# Patient Record
Sex: Male | Born: 2000
Health system: Southern US, Community
[De-identification: ages and names within clinical notes are randomized; demographics above are authoritative.]

## PROBLEM LIST (undated history)

## (undated) DIAGNOSIS — K219 Gastro-esophageal reflux disease without esophagitis: Secondary | ICD-10-CM

## (undated) HISTORY — PX: UMBILICAL HERNIA REPAIR: SHX196

## (undated) HISTORY — PX: URETHRAL DILATION: SUR417

## (undated) HISTORY — DX: Gastro-esophageal reflux disease without esophagitis: K21.9

---
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
## 2019-07-26 ENCOUNTER — Telehealth: Payer: Self-pay | Admitting: Family

## 2019-07-26 ENCOUNTER — Other Ambulatory Visit: Payer: Self-pay | Admitting: *Deleted

## 2019-07-26 DIAGNOSIS — Z20822 Contact with and (suspected) exposure to covid-19: Secondary | ICD-10-CM

## 2019-07-27 ENCOUNTER — Encounter: Payer: Self-pay | Admitting: Family Medicine

## 2019-07-27 ENCOUNTER — Ambulatory Visit (INDEPENDENT_AMBULATORY_CARE_PROVIDER_SITE_OTHER): Payer: Managed Care, Other (non HMO) | Admitting: Family Medicine

## 2019-07-27 DIAGNOSIS — J029 Acute pharyngitis, unspecified: Secondary | ICD-10-CM

## 2019-07-27 LAB — NOVEL CORONAVIRUS, NAA: SARS-CoV-2, NAA: NOT DETECTED

## 2019-07-27 MED ORDER — AMOXICILLIN 500 MG PO CAPS: 500.0000 mg | ORAL_CAPSULE | Freq: Two times a day (BID) | ORAL | 0 refills | Status: DC

## 2019-07-27 NOTE — Progress Notes (Signed)
   Virtual Visit via telephone Note  I connected with Carl Delgado on 07/27/19 at 1719 by telephone and verified that I am speaking with the correct person using two identifiers. Carl Delgado is currently located at home and no other people are currently with her during visit. The provider, Fransisca Kaufmann , MD is located in their office at time of visit.  Call ended at 1725  I discussed the limitations, risks, security and privacy concerns of performing an evaluation and management service by telephone and the availability of in person appointments. I also discussed with the patient that there may be a patient responsible charge related to this service. The patient expressed understanding and agreed to proceed.   History and Present Illness: Patient is calling in with lightheadedness and dizziness and sore throat. He also had nausea and vomiting.  His throat is swollen and hurts to swallow.  He had fever and chills.  He denies SOB or wheezing and he tested negative for covid.   No diagnosis found.  Outpatient Encounter Medications as of 07/27/2019  Medication Sig  . fluticasone (FLONASE) 50 MCG/ACT nasal spray Place 1 spray into both nostrils 2 (two) times daily as needed for allergies or rhinitis.   No facility-administered encounter medications on file as of 07/27/2019.     Review of Systems  Constitutional: Positive for chills and fever.  HENT: Positive for congestion, postnasal drip, rhinorrhea, sinus pressure and sore throat. Negative for ear discharge, ear pain, sneezing and voice change.   Eyes: Negative for pain, discharge, redness and visual disturbance.  Respiratory: Positive for cough. Negative for shortness of breath and wheezing.   Cardiovascular: Negative for chest pain and leg swelling.  Musculoskeletal: Negative for gait problem.  Skin: Negative for rash.  All other systems reviewed and are negative.   Observations/Objective: Patient sounds comfortable and in no acute  distress  Assessment and Plan: Problem List Items Addressed This Visit    None    Visit Diagnoses    Pharyngitis, unspecified etiology    -  Primary   Relevant Medications   amoxicillin (AMOXIL) 500 MG capsule       Follow Up Instructions:  As needed, continue to quarantine   I discussed the assessment and treatment plan with the patient. The patient was provided an opportunity to ask questions and all were answered. The patient agreed with the plan and demonstrated an understanding of the instructions.   The patient was advised to call back or seek an in-person evaluation if the symptoms worsen or if the condition fails to improve as anticipated.  The above assessment and management plan was discussed with the patient. The patient verbalized understanding of and has agreed to the management plan. Patient is aware to call the clinic if symptoms persist or worsen. Patient is aware when to return to the clinic for a follow-up visit. Patient educated on when it is appropriate to go to the emergency department.    I provided 6 minutes of non-face-to-face time during this encounter.    Worthy Rancher, MD

## 2020-01-09 ENCOUNTER — Other Ambulatory Visit: Payer: Self-pay

## 2020-01-10 ENCOUNTER — Ambulatory Visit (INDEPENDENT_AMBULATORY_CARE_PROVIDER_SITE_OTHER): Payer: BC Managed Care – PPO | Admitting: Family Medicine

## 2020-01-10 ENCOUNTER — Encounter: Payer: Self-pay | Admitting: Family Medicine

## 2020-01-10 VITALS — BP 117/73 | HR 64 | Temp 98.6°F | Ht 66.0 in | Wt 126.8 lb

## 2020-01-10 DIAGNOSIS — Z136 Encounter for screening for cardiovascular disorders: Secondary | ICD-10-CM | POA: Diagnosis not present

## 2020-01-10 DIAGNOSIS — Z0000 Encounter for general adult medical examination without abnormal findings: Secondary | ICD-10-CM | POA: Diagnosis not present

## 2020-01-10 DIAGNOSIS — Z114 Encounter for screening for human immunodeficiency virus [HIV]: Secondary | ICD-10-CM

## 2020-01-10 NOTE — Progress Notes (Signed)
Assessment & Plan:  1. Well adult exam - Preventive health education provided. Patient declined lab work and influenza. His TDAP is due later this year; patient was made aware. HIV screening completed today.  - HIV Antibody (routine testing w rflx) - CBC with Differential/Platelet - CMP14+EGFR - Lipid panel  2. Encounter for screening for HIV - HIV Antibody (routine testing w rflx)   Follow-up: Return in about 1 year (around 01/09/2021) for annual physical.   Carl Limes, MSN, APRN, FNP-C [facility]  Subjective:  Patient ID: Carl Delgado, male    DOB: 10-Apr-2001  Age: 19 y.o. MRN: [id number]  Patient Care Team: Sharion Balloon, FNP as PCP - General (Family Medicine)   CC:  Chief Complaint  Patient presents with  . Annual Exam    CPEFOR WORK     HPI Carl Delgado presents for his annual physical.   Occupation: [name], Marital status: single, Substance use: none Diet: regular, Exercise: stays active and work is physical Last eye exam: few months ago Last dental exam: 6 months ago Immunizations: Flu Vaccine: declined  Tdap Vaccine: UTD   No complaints or concerns today.   Depression screen [facility] 2/9 01/10/2020 04/23/2017 03/16/2017  Decreased Interest 0 2 0  Down, Depressed, Hopeless 1 2 0  PHQ - 2 Score 1 4 0  Altered sleeping 3 3 -  Tired, decreased energy 3 3 -  Change in appetite 2 2 -  Feeling bad or failure about yourself  0 1 -  Trouble concentrating 1 1 -  Moving slowly or fidgety/restless 1 1 -  Suicidal thoughts 1 0 -  PHQ-9 Score 12 15 -  Difficult doing work/chores Not difficult at all Not difficult at all -   GAD 7 : Generalized Anxiety Score 01/10/2020  Nervous, Anxious, on Edge 3  Control/stop worrying 2  Worry too much - different things 3  Trouble relaxing 3  Restless 3  Easily annoyed or irritable 3  Afraid - awful might happen 2  Total GAD 7 Score 19  Anxiety Difficulty Not difficult at all   Patient  reports he does have a lot of anxiety when in large crowds but otherwise is able to control it well and does not feel he requires medication to treat.    Review of Systems  Constitutional: Negative for chills, fever, malaise/fatigue and weight loss.  HENT: Negative for congestion, ear discharge, ear pain, nosebleeds, sinus pain, sore throat and tinnitus.   Eyes: Negative for blurred vision, double vision, pain, discharge and redness.  Respiratory: Negative for cough, shortness of breath and wheezing.   Cardiovascular: Negative for chest pain, palpitations and leg swelling.  Gastrointestinal: Negative for abdominal pain, constipation, diarrhea, heartburn, nausea and vomiting.  Genitourinary: Negative for dysuria, frequency and urgency.  Musculoskeletal: Negative for myalgias.  Skin: Negative for rash.  Neurological: Negative for dizziness, seizures, weakness and headaches.  Psychiatric/Behavioral: Negative for depression, substance abuse and suicidal ideas. The patient is not nervous/anxious.    No current outpatient medications on file.  No Known Allergies  Past Medical History:  Diagnosis Date  . GERD (gastroesophageal reflux disease)     Past Surgical History:  Procedure Laterality Date  . UMBILICAL HERNIA REPAIR    . [place]    History reviewed. No pertinent family history.  Social History   Socioeconomic History  . Marital status: Single    Spouse name: Not on file  . Number of children: Not on  file  . Years of education: Not on file  . Highest education level: Not on file  Occupational History  . Not on file  Tobacco Use  . Smoking status: Never Smoker  . Smokeless tobacco: Never Used  Substance and Sexual Activity  . Alcohol use: No  . Drug use: No  . Sexual activity: Not on file  Other Topics Concern  . Not on file  Social History Narrative  . Not on file   Social Determinants of Health   Financial Resource Strain:   . Difficulty of  Paying Living Expenses: Not on file  Food Insecurity:   . Worried About Charity fundraiser in the Last Year: Not on file  . Ran Out of Food in the Last Year: Not on file  Transportation Needs:   . Lack of Transportation (Medical): Not on file  . Lack of Transportation (Non-Medical): Not on file  Physical Activity:   . Days of Exercise per Week: Not on file  . Minutes of Exercise per Session: Not on file  Stress:   . Feeling of Stress : Not on file  Social Connections:   . Frequency of Communication with Friends and Family: Not on file  . Frequency of Social Gatherings with Friends and Family: Not on file  . Attends Religious Services: Not on file  . Active Member of Clubs or Organizations: Not on file  . Attends Archivist Meetings: Not on file  . Marital Status: Not on file  Intimate Partner Violence:   . Fear of Current or Ex-Partner: Not on file  . Emotionally Abused: Not on file  . Physically Abused: Not on file  . Sexually Abused: Not on file      Objective:    BP 117/73   Pulse 64   Temp 98.6 F (37 C) (Temporal)   Ht 5' 6"  (1.676 m)   Wt 126 lb 12.8 oz (57.5 kg)   SpO2 100%   BMI 20.47 kg/m   Wt Readings from Last 3 Encounters:  01/10/20 126 lb 12.8 oz (57.5 kg)  04/23/17 120 lb (54.4 kg) (6 %, Z= -1.55)*  03/16/17 119 lb (54 kg) (6 %, Z= -1.59)*   * Growth percentiles are based on CDC (Boys, 2-20 Years) data.    Physical Exam Vitals reviewed.  Constitutional:      General: He is not in acute distress.    Appearance: Normal appearance. He is normal weight. He is not ill-appearing, toxic-appearing or diaphoretic.  HENT:     Head: Normocephalic and atraumatic.     Right Ear: Tympanic membrane, ear canal and external ear normal. There is no impacted cerumen.     Left Ear: Tympanic membrane, ear canal and external ear normal. There is no impacted cerumen.     Nose: Nose normal. No congestion or rhinorrhea.     Mouth/Throat:     Mouth: Mucous  membranes are moist.     Pharynx: Oropharynx is clear. No oropharyngeal exudate or posterior oropharyngeal erythema.  Eyes:     General: No scleral icterus.       Right eye: No discharge.        Left eye: No discharge.     Conjunctiva/sclera: Conjunctivae normal.     Pupils: Pupils are equal, round, and reactive to light.  Cardiovascular:     Rate and Rhythm: Normal rate and regular rhythm.     Heart sounds: Normal heart sounds. No murmur. No friction rub. No gallop.  Pulmonary:     Effort: Pulmonary effort is normal. No respiratory distress.     Breath sounds: Normal breath sounds. No stridor. No wheezing, rhonchi or rales.  Abdominal:     General: Abdomen is flat. Bowel sounds are normal. There is no distension.     Palpations: Abdomen is soft. There is no mass.     Tenderness: There is no abdominal tenderness. There is no guarding or rebound.     Hernia: No hernia is present.  Musculoskeletal:        General: Normal range of motion.     Cervical back: Normal range of motion and neck supple. No rigidity. No muscular tenderness.     Right lower leg: No edema.     Left lower leg: No edema.  Lymphadenopathy:     Cervical: No cervical adenopathy.  Skin:    General: Skin is warm and dry.     Capillary Refill: Capillary refill takes less than 2 seconds.  Neurological:     General: No focal deficit present.     Mental Status: He is alert and oriented to person, place, and time. Mental status is at baseline.  Psychiatric:        Mood and Affect: Mood normal.        Behavior: Behavior normal.        Thought Content: Thought content normal.        Judgment: Judgment normal.     Lab Results  Component Value Date   TSH 1.720 08/20/2014   Lab Results  Component Value Date   WBC 6.9 08/20/2014   HGB 14.5 08/20/2014   HCT 44.1 08/20/2014   MCV 86.0 08/20/2014   PLT 266 04/06/2014   Lab Results  Component Value Date   NA 140 08/20/2014   K 4.0 08/20/2014   CO2 24 08/20/2014    GLUCOSE 145 (H) 08/20/2014   BUN 9 08/20/2014   CREATININE 0.90 08/20/2014   BILITOT 0.3 08/20/2014   ALKPHOS 124 08/20/2014   AST 16 08/20/2014   ALT 7 08/20/2014   PROT 7.5 08/20/2014   ALBUMIN 4.6 08/20/2014   CALCIUM 9.4 08/20/2014   No results found for: CHOL No results found for: HDL No results found for: LDLCALC No results found for: TRIG No results found for: [facility] Lab Results  Component Value Date   HGBA1C 5.1% 08/27/2014

## 2020-01-10 NOTE — Patient Instructions (Signed)
Preventive Care 19-21 Years Old, Male Preventive care refers to lifestyle choices and visits with your health care provider that can promote health and wellness. This includes:  A yearly physical exam. This is also called an annual well check.  Regular dental and eye exams.  Immunizations.  Screening for certain conditions.  Healthy lifestyle choices, such as eating a healthy diet, getting regular exercise, not using drugs or products that contain nicotine and tobacco, and limiting alcohol use. What can I expect for my preventive care visit? Physical exam Your health care provider will check:  Height and weight. These may be used to calculate body mass index (BMI), which is a measurement that tells if you are at a healthy weight.  Heart rate and blood pressure.  Your skin for abnormal spots. Counseling Your health care provider may ask you questions about:  Alcohol, tobacco, and drug use.  Emotional well-being.  Home and relationship well-being.  Sexual activity.  Eating habits.  Work and work Statistician. What immunizations do I need?  Influenza (flu) vaccine  This is recommended every year. Tetanus, diphtheria, and pertussis (Tdap) vaccine  You may need a Td booster every 10 years. Varicella (chickenpox) vaccine  You may need this vaccine if you have not already been vaccinated. Human papillomavirus (HPV) vaccine  If recommended by your health care provider, you may need three doses over 6 months. Measles, mumps, and rubella (MMR) vaccine  You may need at least one dose of MMR. You may also need a second dose. Meningococcal conjugate (MenACWY) vaccine  One dose is recommended if you are 45-76 years old and a Market researcher living in a residence hall, or if you have one of several medical conditions. You may also need additional booster doses. Pneumococcal conjugate (PCV13) vaccine  You may need this if you have certain conditions and were not  previously vaccinated. Pneumococcal polysaccharide (PPSV23) vaccine  You may need one or two doses if you smoke cigarettes or if you have certain conditions. Hepatitis A vaccine  You may need this if you have certain conditions or if you travel or work in places where you may be exposed to hepatitis A. Hepatitis B vaccine  You may need this if you have certain conditions or if you travel or work in places where you may be exposed to hepatitis B. Haemophilus influenzae type b (Hib) vaccine  You may need this if you have certain risk factors. You may receive vaccines as individual doses or as more than one vaccine together in one shot (combination vaccines). Talk with your health care provider about the risks and benefits of combination vaccines. What tests do I need? Blood tests  Lipid and cholesterol levels. These may be checked every 5 years starting at age 17.  Hepatitis C test.  Hepatitis B test. Screening   Diabetes screening. This is done by checking your blood sugar (glucose) after you have not eaten for a while (fasting).  Sexually transmitted disease (STD) testing. Talk with your health care provider about your test results, treatment options, and if necessary, the need for more tests. Follow these instructions at home: Eating and drinking   Eat a diet that includes fresh fruits and vegetables, whole grains, lean protein, and low-fat dairy products.  Take vitamin and mineral supplements as recommended by your health care provider.  Do not drink alcohol if your health care provider tells you not to drink.  If you drink alcohol: ? Limit how much you have to 0-2  drinks a day. ? Be aware of how much alcohol is in your drink. In the [place] one drink equals one 12 oz bottle of beer (355 mL), one 5 oz glass of wine (148 mL), or one 1 oz glass of hard liquor (44 mL). Lifestyle  Take daily care of your teeth and gums.  Stay active. Exercise for at least 30 minutes on 5 or  more days each week.  Do not use any products that contain nicotine or tobacco, such as cigarettes, e-cigarettes, and chewing tobacco. If you need help quitting, ask your health care provider.  If you are sexually active, practice safe sex. Use a condom or other form of protection to prevent STIs (sexually transmitted infections). What's next?  Go to your health care provider once a year for a well check visit.  Ask your health care provider how often you should have your eyes and teeth checked.  Stay up to date on all vaccines. This information is not intended to replace advice given to you by your health care provider. Make sure you discuss any questions you have with your health care provider. Document Revised: 11/03/2018 Document Reviewed: 11/03/2018 Elsevier Patient Education  2020 [employer].

## 2020-01-11 LAB — CBC WITH DIFFERENTIAL/PLATELET
Basophils Absolute: 0.1 10*3/uL (ref 0.0–0.2)
Basos: 1 %
EOS (ABSOLUTE): 0.1 10*3/uL (ref 0.0–0.4)
Eos: 2 %
Hematocrit: 46.6 % (ref 37.5–51.0)
Hemoglobin: 15.7 g/dL (ref 13.0–17.7)
Immature Grans (Abs): 0 10*3/uL (ref 0.0–0.1)
Immature Granulocytes: 0 %
Lymphocytes Absolute: 1.8 10*3/uL (ref 0.7–3.1)
Lymphs: 32 %
MCH: 29.6 pg (ref 26.6–33.0)
MCHC: 33.7 g/dL (ref 31.5–35.7)
MCV: 88 fL (ref 79–97)
Monocytes Absolute: 0.5 10*3/uL (ref 0.1–0.9)
Monocytes: 9 %
Neutrophils Absolute: 3.1 10*3/uL (ref 1.4–7.0)
Neutrophils: 56 %
Platelets: 275 10*3/uL (ref 150–450)
RBC: 5.3 x10E6/uL (ref 4.14–5.80)
RDW: 13.2 % (ref 11.6–15.4)
WBC: 5.6 10*3/uL (ref 3.4–10.8)

## 2020-01-11 LAB — CMP14+EGFR
ALT: 15 IU/L (ref 0–44)
AST: 25 IU/L (ref 0–40)
Albumin/Globulin Ratio: 1.6 (ref 1.2–2.2)
Albumin: 4.6 g/dL (ref 4.1–5.2)
Alkaline Phosphatase: 80 IU/L (ref 39–117)
BUN/Creatinine Ratio: 13 (ref 9–20)
BUN: 12 mg/dL (ref 6–20)
Bilirubin Total: 0.7 mg/dL (ref 0.0–1.2)
CO2: 25 mmol/L (ref 20–29)
Calcium: 9.5 mg/dL (ref 8.7–10.2)
Chloride: 103 mmol/L (ref 96–106)
Creatinine, Ser: 0.94 mg/dL (ref 0.76–1.27)
GFR calc Af Amer: 133 mL/min/{1.73_m2} (ref >59)
GFR calc non Af Amer: 115 mL/min/{1.73_m2} (ref >59)
Globulin, Total: 2.8 g/dL (ref 1.5–4.5)
Glucose: 93 mg/dL (ref 65–99)
Potassium: 4.3 mmol/L (ref 3.5–5.2)
Sodium: 141 mmol/L (ref 134–144)
Total Protein: 7.4 g/dL (ref 6.0–8.5)

## 2020-01-11 LAB — LIPID PANEL
Chol/HDL Ratio: 3.3 ratio (ref 0.0–5.0)
Cholesterol, Total: 160 mg/dL (ref 100–199)
HDL: 48 mg/dL (ref >39)
LDL Chol Calc (NIH): 103 mg/dL — ABNORMAL HIGH (ref 0–99)
Triglycerides: 44 mg/dL (ref 0–149)
VLDL Cholesterol Cal: 9 mg/dL (ref 5–40)

## 2020-01-11 LAB — HIV ANTIBODY (ROUTINE TESTING W REFLEX): HIV Screen 4th Generation wRfx: NONREACTIVE

## 2020-01-12 ENCOUNTER — Encounter: Payer: Self-pay | Admitting: Family Medicine

## 2020-01-12 NOTE — Telephone Encounter (Signed)
Where you waiting on this? Or something I need to do?

## 2020-01-18 ENCOUNTER — Ambulatory Visit: Payer: BC Managed Care – PPO | Admitting: Family Medicine

## 2020-01-18 ENCOUNTER — Encounter: Payer: Self-pay | Admitting: Family Medicine

## 2020-01-18 ENCOUNTER — Other Ambulatory Visit: Payer: Self-pay

## 2020-01-18 VITALS — BP 134/71 | HR 86 | Temp 98.4°F | Ht 66.0 in | Wt 126.0 lb

## 2020-01-18 DIAGNOSIS — Z113 Encounter for screening for infections with a predominantly sexual mode of transmission: Secondary | ICD-10-CM

## 2020-01-18 DIAGNOSIS — Z202 Contact with and (suspected) exposure to infections with a predominantly sexual mode of transmission: Secondary | ICD-10-CM

## 2020-01-18 NOTE — Progress Notes (Signed)
   Assessment & Plan:  1-2. Routine screening for STI (sexually transmitted infection)/Exposure to sexually transmitted disease (STD) - Discussed importance of test of cure if he does test also for an STI and is treated.  Discussed abstinence until he and his girlfriend have both been treated and have a negative test of cure. - Chlamydia/Gonococcus/Trichomonas, NAA - STD Screen (8)   Follow up plan: Return if symptoms worsen or fail to improve.  Deliah Boston, MSN, APRN, FNP-C [facility]  Subjective:   Patient ID: Carl Delgado, male    DOB: [birth year], 19 y.o.   MRN: [id number]  HPI: Carl Delgado is a 19 y.o. male presenting on 01/18/2020 for Exposure to STD  Patient reports he found out this morning by his girlfriend of the past 3 months tested positive for chlamydia today.  Patient denies any symptoms himself.   ROS: Negative unless specifically indicated above in HPI.   Relevant past medical history reviewed and updated as indicated.   Allergies and medications reviewed and updated.  No current outpatient medications on file.  No Known Allergies  Objective:   BP 134/71   Pulse 86   Temp 98.4 F (36.9 C) (Temporal)   Ht 5\' 6"  (1.676 m)   Wt 126 lb (57.2 kg)   SpO2 99%   BMI 20.34 kg/m    Physical Exam Vitals reviewed.  Constitutional:      General: He is not in acute distress.    Appearance: Normal appearance. He is not ill-appearing, toxic-appearing or diaphoretic.  HENT:     Head: Normocephalic and atraumatic.  Eyes:     General: No scleral icterus.       Right eye: No discharge.        Left eye: No discharge.     Conjunctiva/sclera: Conjunctivae normal.  Cardiovascular:     Rate and Rhythm: Normal rate.  Pulmonary:     Effort: Pulmonary effort is normal. No respiratory distress.  Musculoskeletal:        General: Normal range of motion.     Cervical back: Normal range of motion.  Skin:    General: Skin is warm and dry.    Neurological:     Mental Status: He is alert and oriented to person, place, and time. Mental status is at baseline.  Psychiatric:        Mood and Affect: Mood normal.        Behavior: Behavior normal.        Thought Content: Thought content normal.        Judgment: Judgment normal.

## 2020-01-19 LAB — STD SCREEN (8)
HIV Screen 4th Generation wRfx: NONREACTIVE
HSV 1 Glycoprotein G Ab, IgG: <0.91 index (ref 0.00–0.90)
HSV 2 IgG, Type Spec: <0.91 index (ref 0.00–0.90)
Hep A IgM: NEGATIVE
Hep B C IgM: NEGATIVE
Hep C Virus Ab: <0.1 s/co ratio (ref 0.0–0.9)
Hepatitis B Surface Ag: NEGATIVE
RPR Ser Ql: NONREACTIVE

## 2020-01-21 ENCOUNTER — Encounter: Payer: Self-pay | Admitting: Family Medicine

## 2020-01-21 LAB — CHLAMYDIA/GONOCOCCUS/TRICHOMONAS, NAA
Chlamydia by NAA: NEGATIVE
Gonococcus by NAA: NEGATIVE
Trich vag by NAA: NEGATIVE

## 2020-06-14 DIAGNOSIS — M62830 Muscle spasm of back: Secondary | ICD-10-CM | POA: Diagnosis not present

## 2020-06-14 DIAGNOSIS — R0789 Other chest pain: Secondary | ICD-10-CM | POA: Diagnosis not present

## 2020-06-14 DIAGNOSIS — R531 Weakness: Secondary | ICD-10-CM | POA: Diagnosis not present

## 2020-06-14 DIAGNOSIS — I951 Orthostatic hypotension: Secondary | ICD-10-CM | POA: Diagnosis not present
